# Patient Record
Sex: Male | Born: 1955 | Race: White | Hispanic: No | Marital: Single | Smoking: Current every day smoker
Health system: Southern US, Community
[De-identification: ages and names within clinical notes are randomized; demographics above are authoritative.]

## PROBLEM LIST (undated history)

## (undated) DIAGNOSIS — E785 Hyperlipidemia, unspecified: Secondary | ICD-10-CM

## (undated) DIAGNOSIS — I10 Essential (primary) hypertension: Secondary | ICD-10-CM

## (undated) DIAGNOSIS — E119 Type 2 diabetes mellitus without complications: Secondary | ICD-10-CM

## (undated) DIAGNOSIS — M545 Low back pain, unspecified: Secondary | ICD-10-CM

## (undated) HISTORY — DX: Type 2 diabetes mellitus without complications: E11.9

## (undated) HISTORY — DX: Low back pain, unspecified: M54.50

## (undated) HISTORY — DX: Essential (primary) hypertension: I10

## (undated) HISTORY — DX: Hyperlipidemia, unspecified: E78.5

---

## 2012-05-04 ENCOUNTER — Ambulatory Visit
Admission: RE | Admit: 2012-05-04 | Discharge: 2012-05-04 | Disposition: A | Discharge status: Home / Self Care | Payer: BC Managed Care – PPO | Source: Ambulatory Visit | Attending: Family Medicine | Admitting: Family Medicine

## 2012-05-04 ENCOUNTER — Other Ambulatory Visit: Payer: Self-pay | Admitting: Family Medicine

## 2012-05-04 DIAGNOSIS — M25579 Pain in unspecified ankle and joints of unspecified foot: Secondary | ICD-10-CM

## 2012-05-04 DIAGNOSIS — R609 Edema, unspecified: Secondary | ICD-10-CM

## 2012-05-04 DIAGNOSIS — M79673 Pain in unspecified foot: Secondary | ICD-10-CM

## 2013-04-08 IMAGING — CR DG OS CALCIS 2+V*L*
3 series · 3 of 3 positions shown · non-contrast
Comparison: None.

CLINICAL DATA: Heel pain, no injury

LEFT OS CALCIS - 2+ VIEW

[view not recorded (1 of 3)]
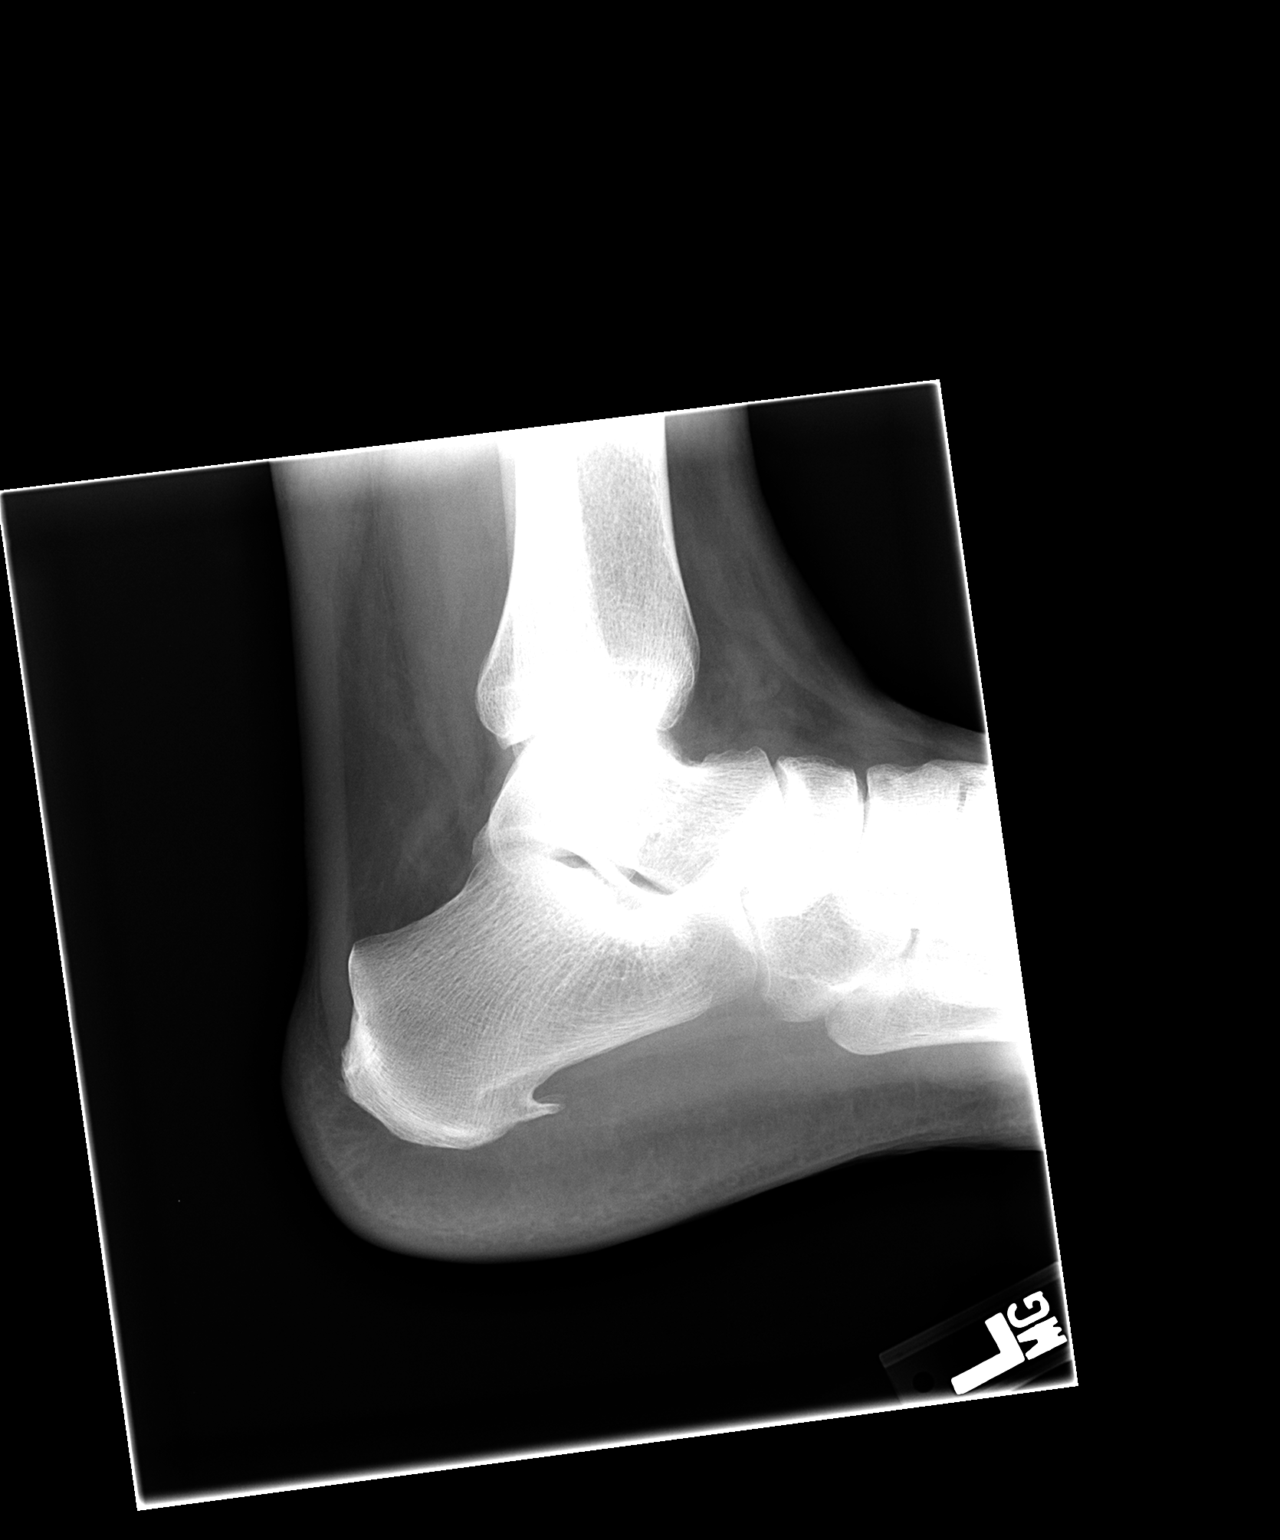

[view not recorded (2 of 3)]
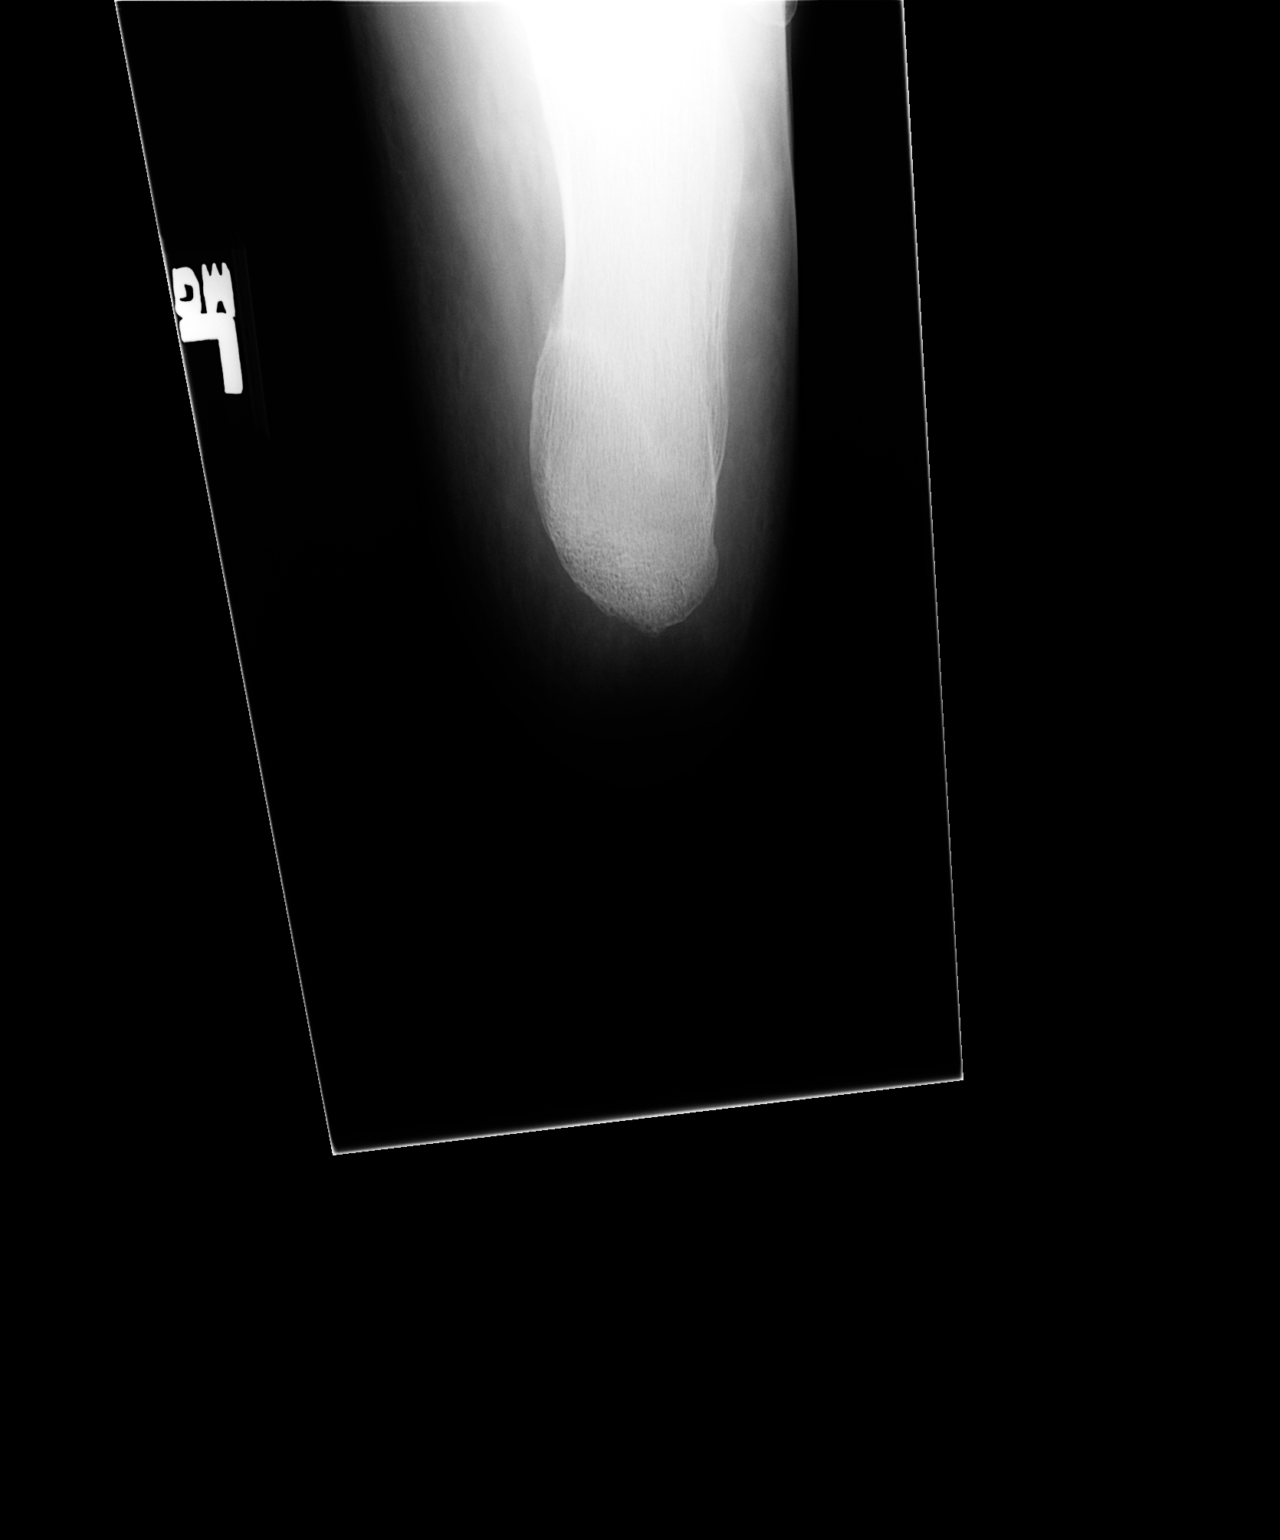

[view not recorded (3 of 3)]
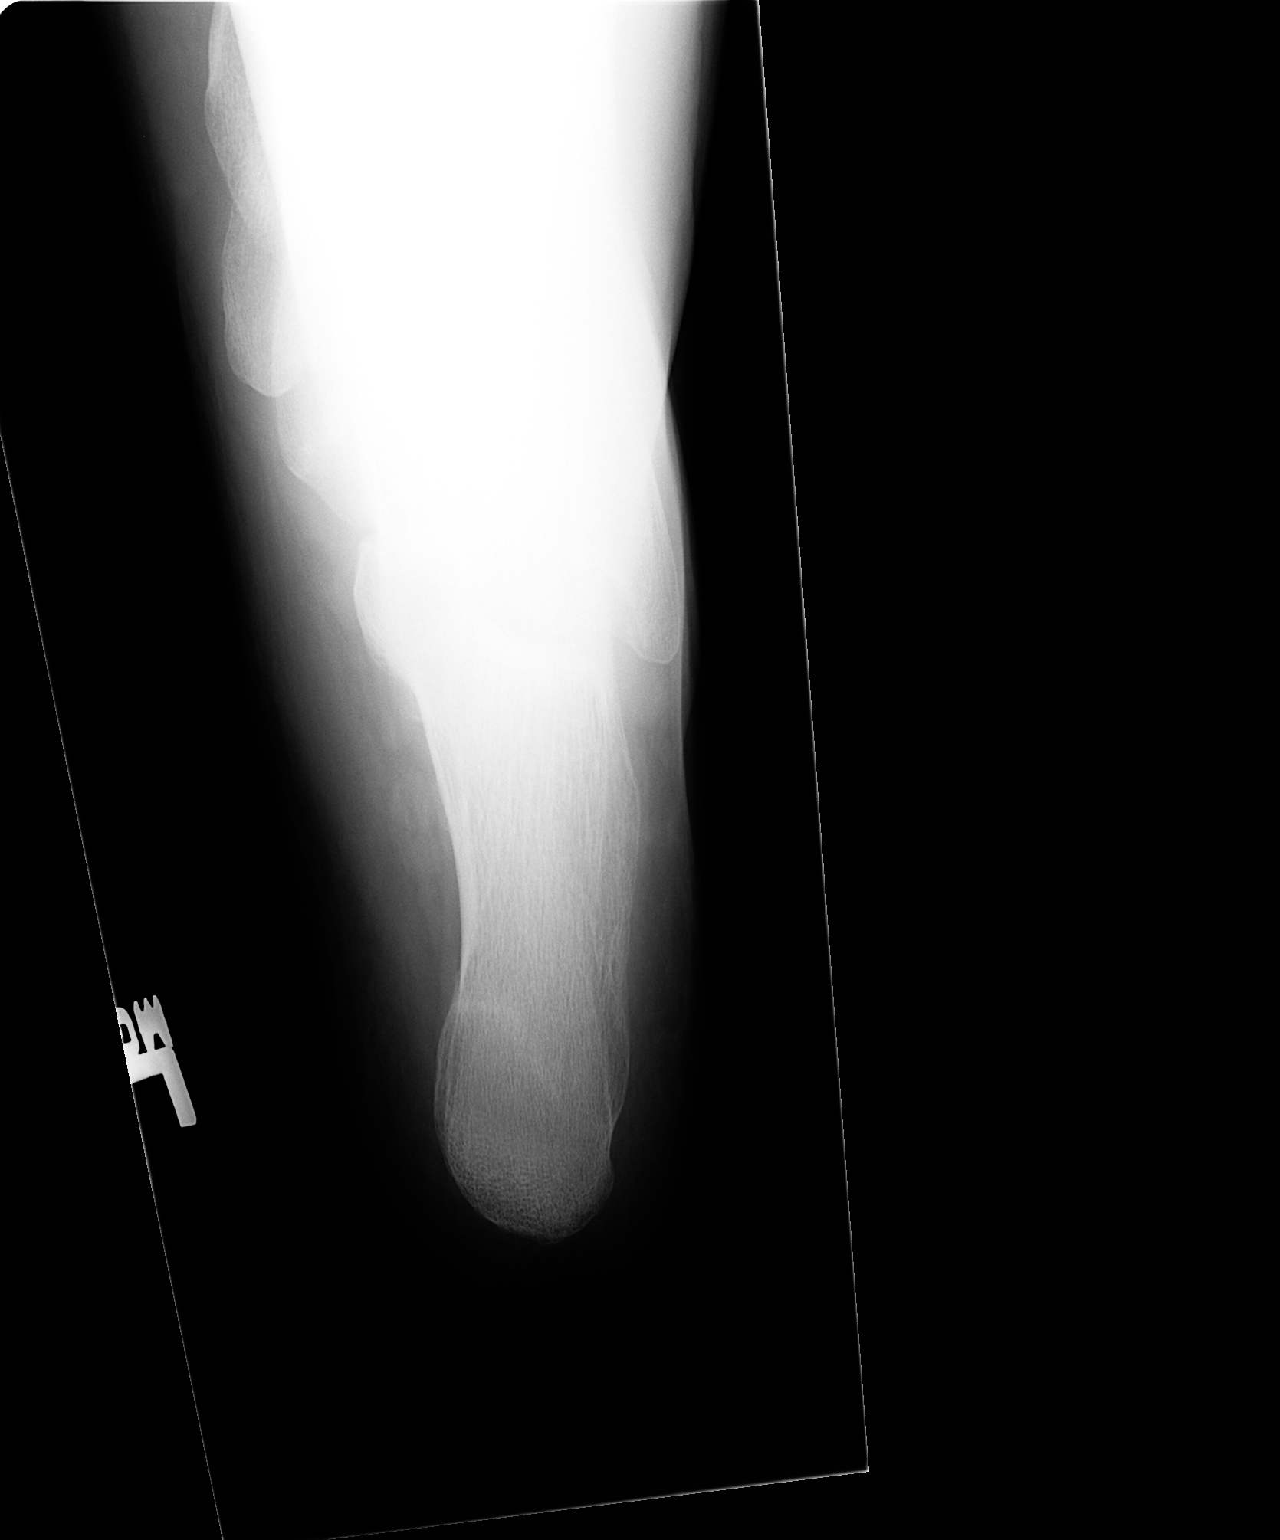

[3 of 3 positions shown; findings below may reference images not displayed]

FINDINGS: No acute bony abnormality is seen.  No focal
demineralization is noted.  There is a plantar calcaneal
degenerative spur present.
IMPRESSION: No acute abnormality.  Plantar calcaneal degenerative spur.

## 2014-08-21 ENCOUNTER — Ambulatory Visit (INDEPENDENT_AMBULATORY_CARE_PROVIDER_SITE_OTHER): Payer: BC Managed Care – PPO | Admitting: Emergency Medicine

## 2014-08-21 ENCOUNTER — Ambulatory Visit (INDEPENDENT_AMBULATORY_CARE_PROVIDER_SITE_OTHER): Payer: BC Managed Care – PPO

## 2014-08-21 VITALS — BP 110/74 | HR 114 | Temp 98.0°F | Resp 16 | Ht 67.0 in | Wt 201.0 lb

## 2014-08-21 DIAGNOSIS — M545 Low back pain, unspecified: Secondary | ICD-10-CM

## 2014-08-21 MED ORDER — CYCLOBENZAPRINE HCL 10 MG PO TABS: 10.0000 mg | ORAL_TABLET | Freq: Three times a day (TID) | ORAL | Status: DC | PRN

## 2014-08-21 MED ORDER — CELECOXIB 200 MG PO CAPS: 200.0000 mg | ORAL_CAPSULE | Freq: Two times a day (BID) | ORAL | Status: DC

## 2014-08-21 NOTE — Progress Notes (Signed)
[facility] [address] [place] [address] [phone]  Date:  08/21/2014   Name:  Daniel Johnson   DOB:  12-13-1956   MRN:  [id number]  PCP:  Ignacia Palma., MD    Chief Complaint: Back Pain and Hip Pain   History of Present Illness:  Daniel Johnson is a 58 y.o. very pleasant male patient who presents with the following:  Seen previously by FMD and referred to chiropractor who completed his treatment.  Has bee seeing a physical therapist twice weekly.  Has achieved little relieft.  Pain is not radiating, no neuro symptoms. Says injured lifting equipment. No improvement with over the counter medications or other home remedies.  Denies other complaint or health concern today.   There are no active problems to display for this patient.   Past Medical History  Diagnosis Date  . Diabetes mellitus without complication     No past surgical history on file.  History  Substance Use Topics  . Smoking status: Current Every Day Smoker -- 0.50 packs/day    Types: Cigarettes  . Smokeless tobacco: Not on file  . Alcohol Use: Yes    Family History  Problem Relation Age of Onset  . Diabetes Father     No Known Allergies  Medication list has been reviewed and updated.  No current outpatient prescriptions on file prior to visit.   No current facility-administered medications on file prior to visit.    Review of Systems:  As per HPI, otherwise negative.    Physical Examination: Filed Vitals:   08/21/14 1412  BP: 110/74  Pulse: 114  Temp: 98 F (36.7 C)  Resp: 16   Filed Vitals:   08/21/14 1412  Height:  (1.702 m)  Weight: 201 lb (91.173 kg)   Body mass index is 31.47 kg/(m^2). Ideal Body Weight: Weight in (lb) to have BMI = 25: 159.3  GEN: WDWN, NAD, Non-toxic, A & O x 3 HEENT: Atraumatic, Normocephalic. Neck supple. No masses, No LAD. Ears and Nose: No external deformity. CV: RRR, No M/G/R. No JVD. No thrill. No extra heart  sounds. PULM: CTA B, no wheezes, crackles, rhonchi. No retractions. No resp. distress. No accessory muscle use. ABD: S, NT, ND, +BS. No rebound. No HSM. EXTR: No c/c/e NEURO Normal gait.  PSYCH: Normally interactive. Conversant. Not depressed or anxious appearing.  Calm demeanor.    Assessment and Plan: Back pain celebrex Flexeril  Signed,  Phillips Odor, MD   [facility] reading (PRIMARY) by  Dr. Dareen Piano.  Mild scoliosis and incomplete pars.

## 2014-08-21 NOTE — Patient Instructions (Signed)
Back Pain, Adult Low back pain is very common. About 1 in 5 people have back pain.The cause of low back pain is rarely dangerous. The pain often gets better over time.About half of people with a sudden onset of back pain feel better in just 2 weeks. About 8 in 10 people feel better by 6 weeks.  CAUSES Some common causes of back pain include:  Strain of the muscles or ligaments supporting the spine.  Wear and tear (degeneration) of the spinal discs.  Arthritis.  Direct injury to the back. DIAGNOSIS Most of the time, the direct cause of low back pain is not known.However, back pain can be treated effectively even when the exact cause of the pain is unknown.Answering your caregiver's questions about your overall health and symptoms is one of the most accurate ways to make sure the cause of your pain is not dangerous. If your caregiver needs more information, he or she may order lab work or imaging tests (X-rays or MRIs).However, even if imaging tests show changes in your back, this usually does not require surgery. HOME CARE INSTRUCTIONS For many people, back pain returns.Since low back pain is rarely dangerous, it is often a condition that people can learn to manageon their own.   Remain active. It is stressful on the back to sit or stand in one place. Do not sit, drive, or stand in one place for more than 30 minutes at a time. Take short walks on level surfaces as soon as pain allows.Try to increase the length of time you walk each day.  Do not stay in bed.Resting more than 1 or 2 days can delay your recovery.  Do not avoid exercise or work.Your body is made to move.It is not dangerous to be active, even though your back may hurt.Your back will likely heal faster if you return to being active before your pain is gone.  Pay attention to your body when you bend and lift. Many people have less discomfortwhen lifting if they bend their knees, keep the load close to their bodies,and  avoid twisting. Often, the most comfortable positions are those that put less stress on your recovering back.  Find a comfortable position to sleep. Use a firm mattress and lie on your side with your knees slightly bent. If you lie on your back, put a pillow under your knees.  Only take over-the-counter or prescription medicines as directed by your caregiver. Over-the-counter medicines to reduce pain and inflammation are often the most helpful.Your caregiver may prescribe muscle relaxant drugs.These medicines help dull your pain so you can more quickly return to your normal activities and healthy exercise.  Put ice on the injured area.  Put ice in a plastic bag.  Place a towel between your skin and the bag.  Leave the ice on for 15-20 minutes, 03-04 times a day for the first 2 to 3 days. After that, ice and heat may be alternated to reduce pain and spasms.  Ask your caregiver about trying back exercises and gentle massage. This may be of some benefit.  Avoid feeling anxious or stressed.Stress increases muscle tension and can worsen back pain.It is important to recognize when you are anxious or stressed and learn ways to manage it.Exercise is a great option. SEEK MEDICAL CARE IF:  You have pain that is not relieved with rest or medicine.  You have pain that does not improve in 1 week.  You have new symptoms.  You are generally not feeling well. SEEK   IMMEDIATE MEDICAL CARE IF:   You have pain that radiates from your back into your legs.  You develop new bowel or bladder control problems.  You have unusual weakness or numbness in your arms or legs.  You develop nausea or vomiting.  You develop abdominal pain.  You feel faint. Document Released: 12/13/2005 Document Revised: 06/13/2012 Document Reviewed: 04/16/2014 ExitCare Patient Information 2015 [employer]. This information is not intended to replace advice given to you by your health care provider. Make sure you  discuss any questions you have with your health care provider.  

## 2014-08-23 ENCOUNTER — Telehealth: Payer: Self-pay

## 2014-08-23 NOTE — Telephone Encounter (Signed)
PA needed for celecoxib. Started on covermymeds. LMOM for pt to CB. Need to know what other NSAIDS pt has tried previously.

## 2014-08-30 NOTE — Telephone Encounter (Signed)
Reached pt and he stated that his pain is bearable and he has decided to not take this med now. He "doesn't like the way some medicines make him feel". instr'd pt to CB if he decides he would like to try it. Pt agreed.

## 2020-05-15 ENCOUNTER — Other Ambulatory Visit: Payer: Self-pay | Admitting: Family Medicine

## 2020-05-15 DIAGNOSIS — Z720 Tobacco use: Secondary | ICD-10-CM

## 2020-05-15 DIAGNOSIS — Z136 Encounter for screening for cardiovascular disorders: Secondary | ICD-10-CM

## 2020-05-28 ENCOUNTER — Ambulatory Visit
Admission: RE | Admit: 2020-05-28 | Discharge: 2020-05-28 | Disposition: A | Discharge status: Home / Self Care | Payer: Medicare Other | Source: Ambulatory Visit | Attending: Family Medicine | Admitting: Family Medicine

## 2020-05-28 ENCOUNTER — Other Ambulatory Visit: Payer: Self-pay

## 2020-05-28 DIAGNOSIS — Z720 Tobacco use: Secondary | ICD-10-CM

## 2020-05-28 DIAGNOSIS — Z136 Encounter for screening for cardiovascular disorders: Secondary | ICD-10-CM

## 2021-05-02 IMAGING — US US ABDOMINAL AORTA SCREENING AAA
1 series · 14 of 23 positions shown · non-contrast
Comparison: No prior.

CLINICAL DATA: Medicare screening.

EXAM:
US ABDOMINAL AORTA MEDICARE SCREENING
TECHNIQUE: Ultrasound examination of the abdominal aorta was performed as a
screening evaluation for abdominal aortic aneurysm.

[Series 1: us abdominal aorta screening aaa · 0.26mm/px · 14 of 23 slices shown]
[im 1/23]
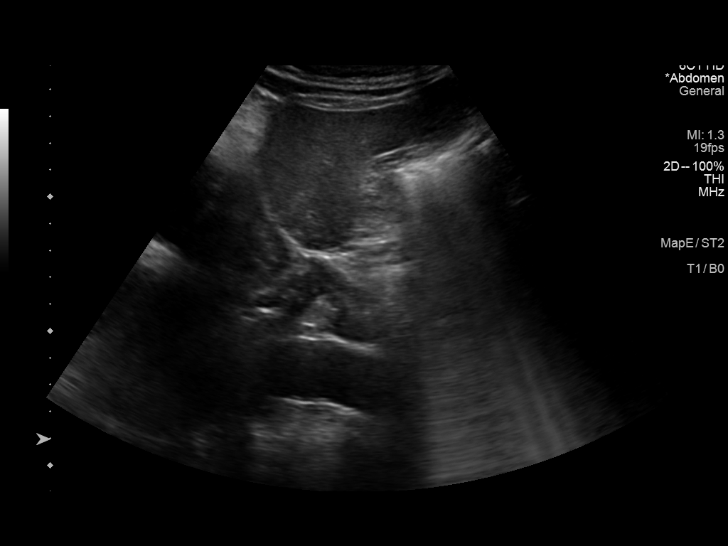
[im 3/23]
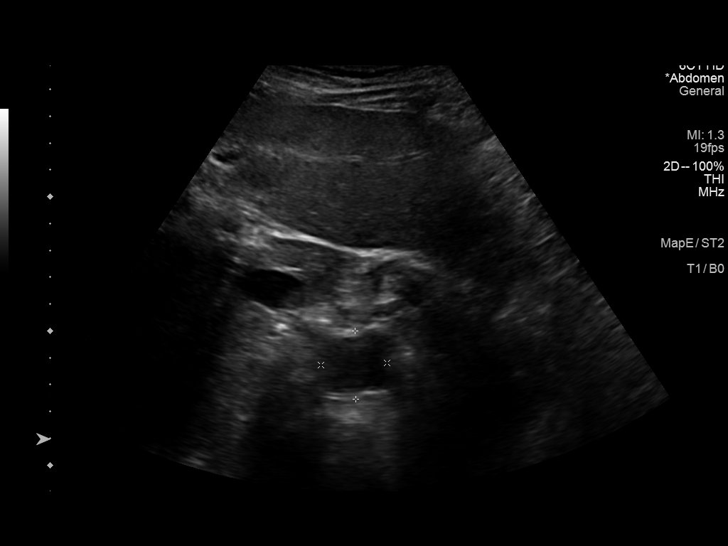
[im 5/23]
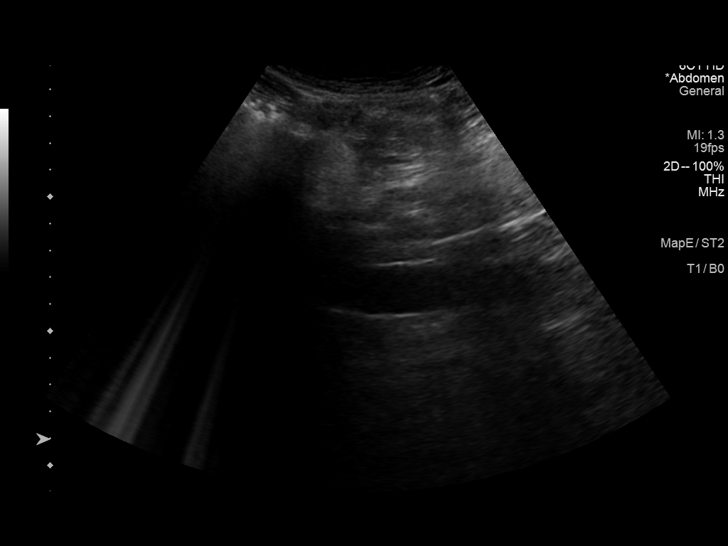
[im 6/23]
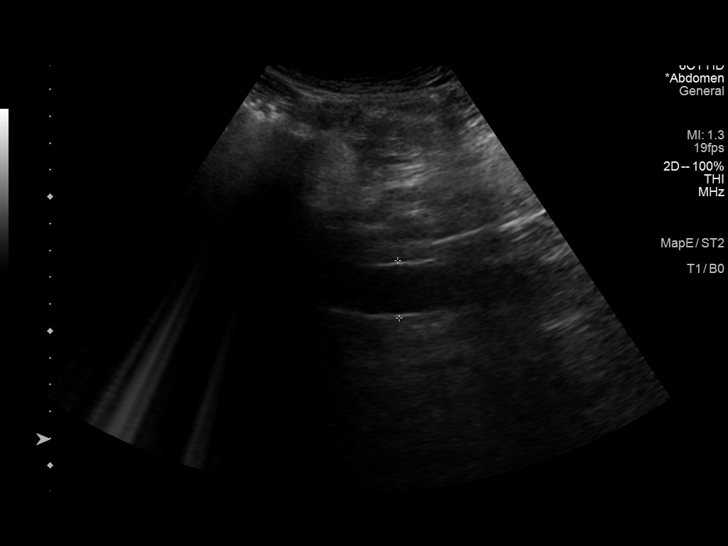
[im 8/23]
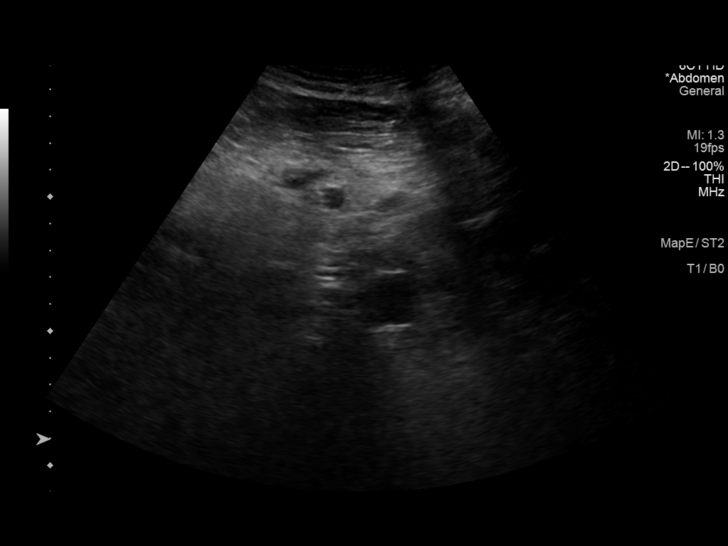
[im 10/23]
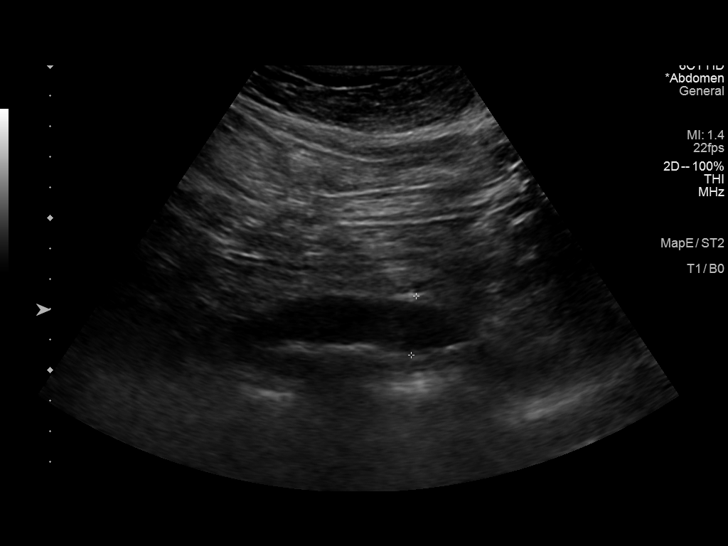
[im 11/23]
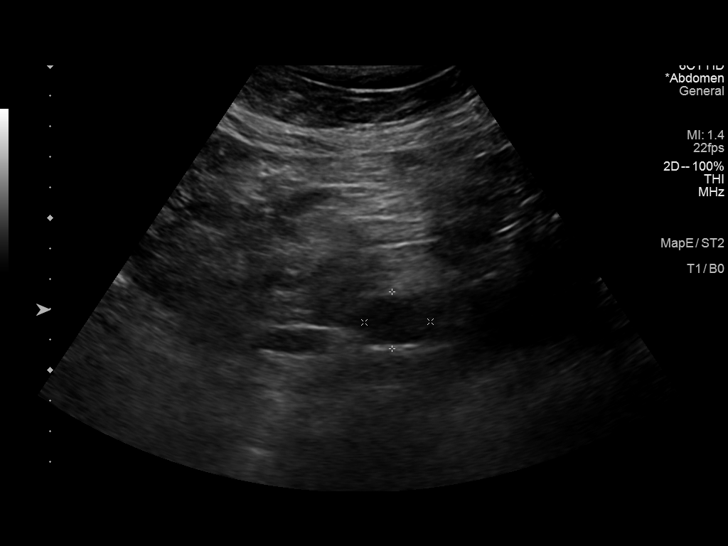
[im 13/23]
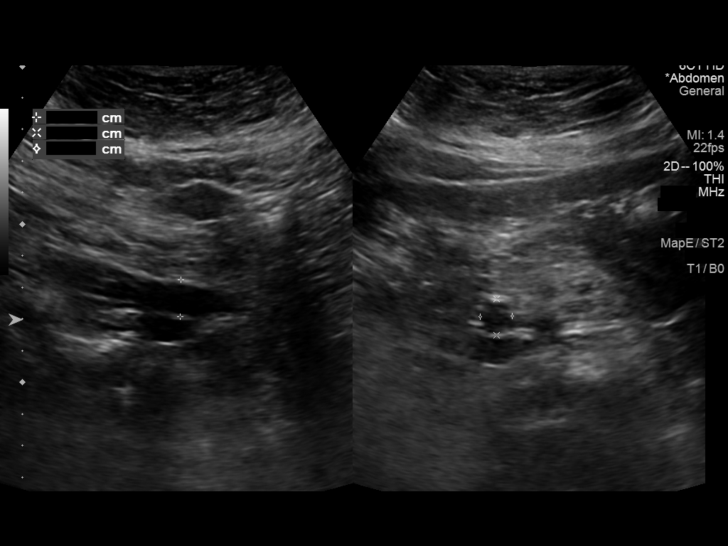
[im 14/23]
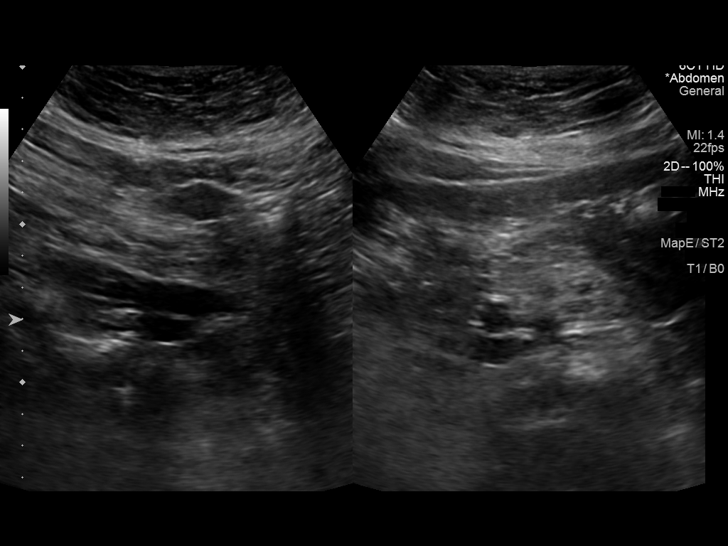
[im 16/23]
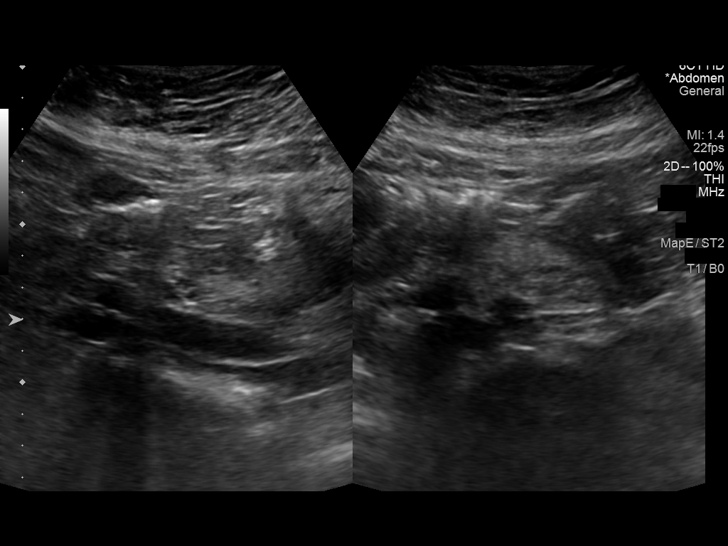
[im 18/23]
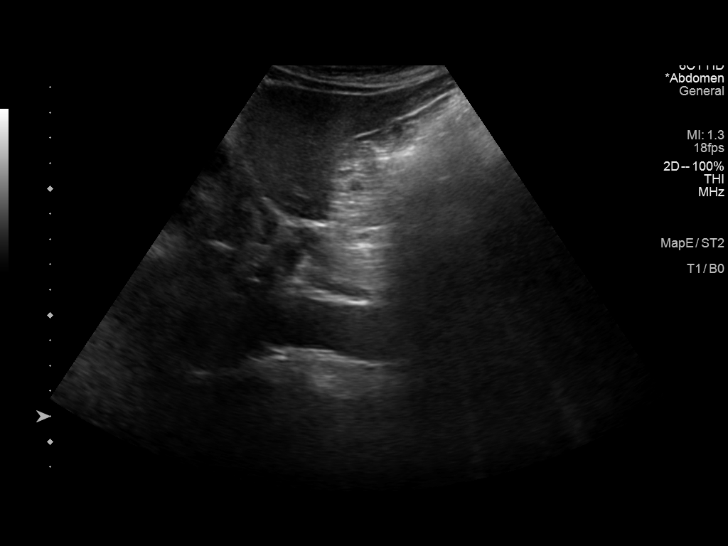
[im 19/23]
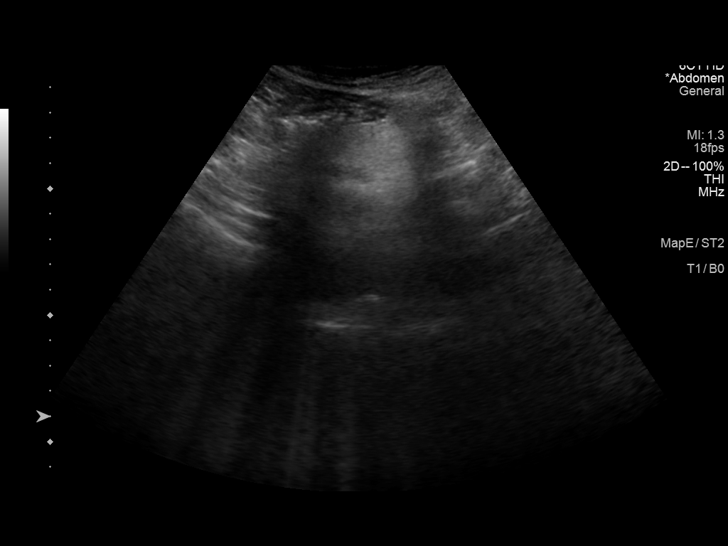
[im 21/23]
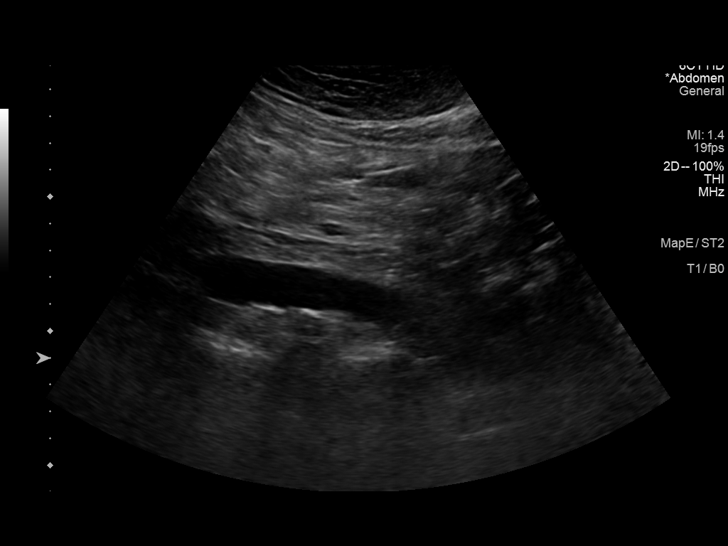
[im 23/23]
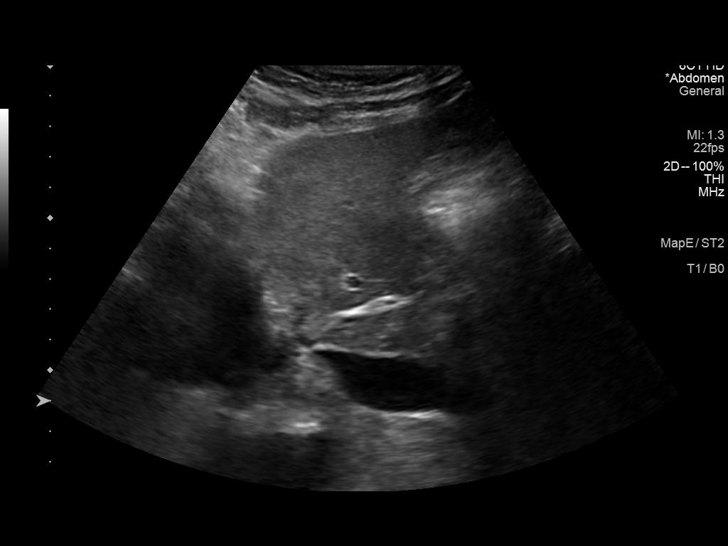

[14 of 23 positions shown; findings below may reference images not displayed]

FINDINGS: Abdominal aortic measurements as follows:

Proximal:  2.5 cm cm

Mid:  2.2 cm cm

Distal:  2.2 cm cm
IMPRESSION: No evidence of aortic aneurysm.

## 2024-03-20 ENCOUNTER — Ambulatory Visit (INDEPENDENT_AMBULATORY_CARE_PROVIDER_SITE_OTHER): Admitting: Family Medicine

## 2024-03-20 ENCOUNTER — Encounter: Payer: Self-pay | Admitting: Family Medicine

## 2024-03-20 VITALS — BP 112/64 | HR 88 | Temp 97.9°F | Ht 67.0 in | Wt 195.5 lb

## 2024-03-20 DIAGNOSIS — N400 Enlarged prostate without lower urinary tract symptoms: Secondary | ICD-10-CM

## 2024-03-20 DIAGNOSIS — M545 Low back pain, unspecified: Secondary | ICD-10-CM

## 2024-03-20 DIAGNOSIS — Z7984 Long term (current) use of oral hypoglycemic drugs: Secondary | ICD-10-CM | POA: Diagnosis not present

## 2024-03-20 DIAGNOSIS — Z125 Encounter for screening for malignant neoplasm of prostate: Secondary | ICD-10-CM

## 2024-03-20 DIAGNOSIS — E118 Type 2 diabetes mellitus with unspecified complications: Secondary | ICD-10-CM

## 2024-03-20 DIAGNOSIS — G8929 Other chronic pain: Secondary | ICD-10-CM

## 2024-03-20 DIAGNOSIS — Z1211 Encounter for screening for malignant neoplasm of colon: Secondary | ICD-10-CM
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
## 2024-03-21 LAB — PSA: PSA: 0.27 ng/mL (ref <=4.00)

## 2024-04-04 ENCOUNTER — Telehealth: Payer: Self-pay

## 2024-04-04 ENCOUNTER — Other Ambulatory Visit: Payer: Self-pay | Admitting: Family Medicine

## 2024-04-04 NOTE — Telephone Encounter (Signed)
 Reason for CRM: patient returning a call regarding rosuvastatin 20mg . Patient is still taking the medication once a day .

## 2024-04-04 NOTE — Telephone Encounter (Signed)
 Copied from CRM [id number]. Topic: General - Other >> Apr 04, 2024  4:19 PM Daniel Johnson wrote: Reason for CRM: patient returning a call regarding rosuvastatin 20mg . Patient is still taking the medication once a day .

## 2024-04-04 NOTE — Telephone Encounter (Signed)
 Prescription Request  04/04/2024  LOV: 03/20/24  What is the name of the medication or equipment? zolpidem (AMBIEN) 10 MG tablet [[id number]]   Have you contacted your pharmacy to request a refill? Yes   Which pharmacy would you like this sent to?  [id number] [place], [place] - [address] [place] [address] Phone: [phone] Fax: [phone]    Patient notified that their request is being sent to the clinical staff for review and that they should receive a response within 2 business days.   Please advise at [facility] [phone]

## 2024-04-04 NOTE — Telephone Encounter (Signed)
 Prescription Request  04/04/2024  LOV: 03/20/24  What is the name of the medication or equipment? Rosuvastatin calcium 20 MG (med not found on current med list)  Have you contacted your pharmacy to request a refill? Yes   Which pharmacy would you like this sent to?  [id number] [place], [place] - [address] [place] [address] Phone: [phone] Fax: [phone]    Patient notified that their request is being sent to the clinical staff for review and that they should receive a response within 2 business days.   Please advise at [facility] [phone]

## 2024-04-04 NOTE — Telephone Encounter (Signed)
 Patient called, left VM to return the call to the office. The pharmacy requested rosuvastatin which is not on the patient's current or past medication list. Will need to know from the patient if he is taking rosuvastatin, dosage, how often. Will route to the office for the provider to review.

## 2024-04-05 ENCOUNTER — Other Ambulatory Visit: Payer: Self-pay

## 2024-04-05 ENCOUNTER — Other Ambulatory Visit: Payer: Self-pay | Admitting: Family Medicine

## 2024-04-05 MED ORDER — ZOLPIDEM TARTRATE 10 MG PO TABS: 10.0000 mg | ORAL_TABLET | Freq: Every evening | ORAL | 3 refills | Status: DC | PRN

## 2024-04-05 MED ORDER — ROSUVASTATIN CALCIUM 20 MG PO TABS: 20.0000 mg | ORAL_TABLET | Freq: Every day | ORAL | 3 refills | Status: AC

## 2024-04-05 NOTE — Telephone Encounter (Signed)
 Requested medications are due for refill today.  yes  Requested medications are on the active medications list.  yes  Last refill. 11/17/2023  Future visit scheduled.   yes  Notes to clinic.  Medication is historical. Missing labs.    Requested Prescriptions  Pending Prescriptions Disp Refills   lisinopril (ZESTRIL) 20 MG tablet [Pharmacy Med Name: LISINOPRIL 20 MG TABLET] 90 tablet 1    Sig: TAKE 1/2 TO 1 TABLET BY MOUTH DAILY TO KEEP BP BELOW 130-135/80-85. CALL MD IF THIS CAUSES LIGHT HEADEDNESS WITH STANDING     Cardiovascular:  ACE Inhibitors Failed - 04/05/2024 10:40 AM      Failed - Cr in normal range and within 180 days    No results found for: "CREATININE", "LABCREAU", "LABCREA", "POCCRE"       Failed - K in normal range and within 180 days    No results found for: "K", "POTASSIUM", "POCK"       Failed - Valid encounter within last 6 months    Recent Outpatient Visits           2 weeks ago Chronic low back pain, unspecified back pain laterality, unspecified whether sciatica present   [facility] Donita Brooks, MD   9 years ago Bilateral low back pain without sciatica   [facility], Tessa Lerner, MD              Passed - Patient is not pregnant      Passed - Last BP in normal range    BP Readings from Last 1 Encounters:  03/20/24 112/64

## 2024-07-05 ENCOUNTER — Ambulatory Visit: Admitting: Family Medicine

## 2024-07-05 ENCOUNTER — Encounter: Payer: Self-pay | Admitting: Family Medicine

## 2024-07-05 VITALS — BP 122/80 | HR 67 | Temp 97.7°F | Ht 67.0 in | Wt 191.1 lb

## 2024-07-05 DIAGNOSIS — Z8589 Personal history of malignant neoplasm of other organs and systems: Secondary | ICD-10-CM | POA: Insufficient documentation

## 2024-07-05 DIAGNOSIS — E118 Type 2 diabetes mellitus with unspecified complications: Secondary | ICD-10-CM

## 2024-07-05 DIAGNOSIS — C44722 Squamous cell carcinoma of skin of right lower limb, including hip: Secondary | ICD-10-CM | POA: Insufficient documentation
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
## 2024-07-06 ENCOUNTER — Ambulatory Visit: Payer: Self-pay | Admitting: Family Medicine

## 2024-07-06 LAB — COMPREHENSIVE METABOLIC PANEL WITH GFR
AG Ratio: 1.9 (calc) (ref 1.0–2.5)
ALT: 14 U/L (ref 9–46)
AST: 17 U/L (ref 10–35)
Albumin: 4.5 g/dL (ref 3.6–5.1)
Alkaline phosphatase (APISO): 29 U/L — ABNORMAL LOW (ref 35–144)
BUN: 8 mg/dL (ref 7–25)
CO2: 29 mmol/L (ref 20–32)
Calcium: 9.1 mg/dL (ref 8.6–10.3)
Chloride: 96 mmol/L — ABNORMAL LOW (ref 98–110)
Creat: 0.9 mg/dL (ref 0.70–1.35)
Globulin: 2.4 g/dL (ref 1.9–3.7)
Glucose, Bld: 124 mg/dL — ABNORMAL HIGH (ref 65–99)
Potassium: 4.6 mmol/L (ref 3.5–5.3)
Sodium: 131 mmol/L — ABNORMAL LOW (ref 135–146)
Total Bilirubin: 0.7 mg/dL (ref 0.2–1.2)
Total Protein: 6.9 g/dL (ref 6.1–8.1)
eGFR: 92 mL/min/1.73m2 (ref >=60)

## 2024-07-06 LAB — HEMOGLOBIN A1C
Hgb A1c MFr Bld: 8.1 % — ABNORMAL HIGH (ref <5.7)
Mean Plasma Glucose: 186 mg/dL
eAG (mmol/L): 10.3 mmol/L

## 2024-07-06 LAB — CBC WITH DIFFERENTIAL/PLATELET
Absolute Lymphocytes: 2005 {cells}/uL (ref 850–3900)
Absolute Monocytes: 599 {cells}/uL (ref 200–950)
Basophils Absolute: 28 {cells}/uL (ref 0–200)
Basophils Relative: 0.5 %
Eosinophils Absolute: 398 {cells}/uL (ref 15–500)
Eosinophils Relative: 7.1 %
HCT: 42.1 % (ref 38.5–50.0)
Hemoglobin: 13.7 g/dL (ref 13.2–17.1)
MCH: 30.8 pg (ref 27.0–33.0)
MCHC: 32.5 g/dL (ref 32.0–36.0)
MCV: 94.6 fL (ref 80.0–100.0)
MPV: 9 fL (ref 7.5–12.5)
Monocytes Relative: 10.7 %
Neutro Abs: 2570 {cells}/uL (ref 1500–7800)
Neutrophils Relative %: 45.9 %
Platelets: 274 Thousand/uL (ref 140–400)
RBC: 4.45 Million/uL (ref 4.20–5.80)
RDW: 12.5 % (ref 11.0–15.0)
Total Lymphocyte: 35.8 %
WBC: 5.6 Thousand/uL (ref 3.8–10.8)

## 2024-07-06 LAB — LIPID PANEL
Cholesterol: 101 mg/dL (ref <200)
HDL: 60 mg/dL (ref >=40)
LDL Cholesterol (Calc): 27 mg/dL
Non-HDL Cholesterol (Calc): 41 mg/dL (ref <130)
Total CHOL/HDL Ratio: 1.7 (calc) (ref <5.0)
Triglycerides: 67 mg/dL (ref <150)

## 2024-07-06 LAB — MICROALBUMIN / CREATININE URINE RATIO
Creatinine, Urine: 82 mg/dL (ref 20–320)
Microalb Creat Ratio: 5 mg/g{creat} (ref <30)
Microalb, Ur: 0.4 mg/dL

## 2024-07-26 ENCOUNTER — Ambulatory Visit: Admitting: Family Medicine

## 2024-07-26 ENCOUNTER — Ambulatory Visit
Admission: RE | Admit: 2024-07-26 | Discharge: 2024-07-26 | Disposition: A | Discharge status: Home / Self Care | Source: Ambulatory Visit | Attending: Family Medicine | Admitting: Family Medicine

## 2024-07-26 ENCOUNTER — Encounter: Payer: Self-pay | Admitting: Family Medicine

## 2024-07-26 VITALS — BP 120/72 | HR 77 | Temp 98.3°F | Ht 67.0 in | Wt 192.0 lb

## 2024-07-26 DIAGNOSIS — M545 Low back pain, unspecified: Secondary | ICD-10-CM

## 2024-07-26 DIAGNOSIS — M542 Cervicalgia: Secondary | ICD-10-CM

## 2024-07-26 MED ORDER — CYCLOBENZAPRINE HCL 10 MG PO TABS: 10.0000 mg | ORAL_TABLET | Freq: Three times a day (TID) | ORAL | 0 refills | Status: AC | PRN
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
## 2024-07-30 ENCOUNTER — Telehealth: Payer: Self-pay

## 2024-07-30 NOTE — Telephone Encounter (Signed)
 Copied from CRM [id number]. Topic: Clinical - Lab/Test Results >> Jul 30, 2024 10:50 AM Daniel Johnson wrote: Reason for CRM: Pt is calling in for imaging results from 7/31. I told the pt the results are not ready yet b/c Daniel Johnson has not left any notes on the results. Please contact the pt at [phone] when the results are ready.

## 2024-08-02 ENCOUNTER — Ambulatory Visit: Payer: Self-pay | Admitting: Family Medicine

## 2024-08-02 NOTE — Progress Notes (Signed)
 Called w/ no  contact made. Lvm and sent mychart result letter for more information.

## 2024-08-14 ENCOUNTER — Telehealth (INDEPENDENT_AMBULATORY_CARE_PROVIDER_SITE_OTHER): Admitting: Family Medicine

## 2024-08-14 ENCOUNTER — Ambulatory Visit: Admitting: Family Medicine

## 2024-08-14 DIAGNOSIS — M51362 Other intervertebral disc degeneration, lumbar region with discogenic back pain and lower extremity pain: Secondary | ICD-10-CM | POA: Diagnosis not present

## 2024-08-14 DIAGNOSIS — M545 Low back pain, unspecified: Secondary | ICD-10-CM | POA: Diagnosis not present

## 2024-08-14 DIAGNOSIS — M5416 Radiculopathy, lumbar region: Secondary | ICD-10-CM

## 2024-08-14 DIAGNOSIS — M4186 Other forms of scoliosis, lumbar region: Secondary | ICD-10-CM

## 2024-08-14 DIAGNOSIS — G8929 Other chronic pain: Secondary | ICD-10-CM
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
## 2024-08-21 ENCOUNTER — Ambulatory Visit
Admission: RE | Admit: 2024-08-21 | Discharge: 2024-08-21 | Disposition: A | Discharge status: Home / Self Care | Source: Ambulatory Visit | Attending: Family Medicine | Admitting: Family Medicine

## 2024-08-21 DIAGNOSIS — M51362 Other intervertebral disc degeneration, lumbar region with discogenic back pain and lower extremity pain: Secondary | ICD-10-CM

## 2024-08-21 DIAGNOSIS — M5416 Radiculopathy, lumbar region: Secondary | ICD-10-CM

## 2024-09-03 ENCOUNTER — Ambulatory Visit: Payer: Self-pay | Admitting: Family Medicine

## 2024-09-04 ENCOUNTER — Other Ambulatory Visit: Payer: Self-pay

## 2024-09-04 ENCOUNTER — Other Ambulatory Visit: Payer: Self-pay | Admitting: Family Medicine

## 2024-09-04 DIAGNOSIS — M51362 Other intervertebral disc degeneration, lumbar region with discogenic back pain and lower extremity pain: Secondary | ICD-10-CM

## 2024-09-04 DIAGNOSIS — M5416 Radiculopathy, lumbar region: Secondary | ICD-10-CM

## 2024-09-04 DIAGNOSIS — M545 Low back pain, unspecified: Secondary | ICD-10-CM

## 2024-09-04 DIAGNOSIS — M542 Cervicalgia: Secondary | ICD-10-CM

## 2024-09-04 DIAGNOSIS — M51372 Other intervertebral disc degeneration, lumbosacral region with discogenic back pain and lower extremity pain: Secondary | ICD-10-CM

## 2024-09-04 DIAGNOSIS — M4186 Other forms of scoliosis, lumbar region: Secondary | ICD-10-CM

## 2024-10-05 ENCOUNTER — Other Ambulatory Visit: Payer: Self-pay | Admitting: Family Medicine
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
## 2024-10-30 ENCOUNTER — Telehealth: Payer: Self-pay | Admitting: Family Medicine

## 2024-10-30 NOTE — Telephone Encounter (Signed)
 Copied from CRM #[id number]. Topic: Clinical - Medical Advice >> Oct 30, 2024  1:53 PM Ivette P wrote: Reason for CRM: Pt wants to know if he should keep PT appt for tomorrow or should cancel until gets MRI results.   Pls follow up with pt before end of day.

## 2024-10-31 ENCOUNTER — Ambulatory Visit (INDEPENDENT_AMBULATORY_CARE_PROVIDER_SITE_OTHER): Admitting: Rehabilitative and Restorative Service Providers"

## 2024-10-31 ENCOUNTER — Encounter: Payer: Self-pay | Admitting: Rehabilitative and Restorative Service Providers"

## 2024-10-31 DIAGNOSIS — M6281 Muscle weakness (generalized): Secondary | ICD-10-CM | POA: Diagnosis not present

## 2024-10-31 DIAGNOSIS — R293 Abnormal posture: Secondary | ICD-10-CM | POA: Diagnosis not present

## 2024-10-31 DIAGNOSIS — R262 Difficulty in walking, not elsewhere classified: Secondary | ICD-10-CM | POA: Diagnosis not present

## 2024-10-31 DIAGNOSIS — M5459 Other low back pain: Secondary | ICD-10-CM | POA: Diagnosis not present

## 2024-11-07 ENCOUNTER — Encounter: Payer: Self-pay | Admitting: Rehabilitative and Restorative Service Providers"

## 2024-11-07 ENCOUNTER — Ambulatory Visit: Admitting: Rehabilitative and Restorative Service Providers"

## 2024-11-07 DIAGNOSIS — M5459 Other low back pain: Secondary | ICD-10-CM

## 2024-11-07 DIAGNOSIS — R293 Abnormal posture: Secondary | ICD-10-CM | POA: Diagnosis not present

## 2024-11-07 DIAGNOSIS — R262 Difficulty in walking, not elsewhere classified: Secondary | ICD-10-CM

## 2024-11-07 DIAGNOSIS — M6281 Muscle weakness (generalized): Secondary | ICD-10-CM | POA: Diagnosis not present
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
## 2024-11-14 ENCOUNTER — Encounter: Payer: Self-pay | Admitting: Rehabilitative and Restorative Service Providers"

## 2024-11-14 ENCOUNTER — Ambulatory Visit (INDEPENDENT_AMBULATORY_CARE_PROVIDER_SITE_OTHER): Admitting: Rehabilitative and Restorative Service Providers"

## 2024-11-14 DIAGNOSIS — M5459 Other low back pain: Secondary | ICD-10-CM

## 2024-11-14 DIAGNOSIS — R293 Abnormal posture: Secondary | ICD-10-CM

## 2024-11-14 DIAGNOSIS — M6281 Muscle weakness (generalized): Secondary | ICD-10-CM

## 2024-11-14 DIAGNOSIS — R262 Difficulty in walking, not elsewhere classified: Secondary | ICD-10-CM
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
## 2024-11-21 ENCOUNTER — Encounter: Admitting: Rehabilitative and Restorative Service Providers"

## 2024-11-28 ENCOUNTER — Encounter: Admitting: Rehabilitative and Restorative Service Providers"

## 2024-12-03 ENCOUNTER — Telehealth: Payer: Self-pay | Admitting: Family Medicine

## 2024-12-03 NOTE — Telephone Encounter (Unsigned)
 Copied from CRM [id number]. Topic: Clinical - Medication Refill >> Dec 03, 2024  2:35 PM Ivette P wrote: Medication: JENTADUETO 2.04-999 MG TABS  Has the patient contacted their pharmacy? Yes (Agent: If no, request that the patient contact the pharmacy for the refill. If patient does not wish to contact the pharmacy document the reason why and proceed with request.) (Agent: If yes, when and what did the pharmacy advise?)  This is the patient's preferred pharmacy:  [id number] [address] [facility] - [address] [place] [address] Phone: [phone] Fax: [phone]  Is this the correct pharmacy for this prescription? Yes If no, delete pharmacy and type the correct one.   Has the prescription been filled recently? No  Is the patient out of the medication? Yes  Has the patient been seen for an appointment in the last year OR does the patient have an upcoming appointment? Yes  Can we respond through MyChart? Yes  Agent: Please be advised that Rx refills may take up to 3 business days. We ask that you follow-up with your pharmacy.

## 2024-12-05 ENCOUNTER — Other Ambulatory Visit: Payer: Self-pay

## 2024-12-05 ENCOUNTER — Telehealth: Payer: Self-pay

## 2024-12-05 ENCOUNTER — Encounter: Admitting: Rehabilitative and Restorative Service Providers"

## 2024-12-05 NOTE — Telephone Encounter (Signed)
 Requested medication (s) are due for refill today - unsure  Requested medication (s) are on the active medication list -yes  Future visit scheduled -yes  Last refill: 01/18/24  Notes to clinic: listed as historical medication   Requested Prescriptions  Pending Prescriptions Disp Refills   JENTADUETO 2.04-999 MG TABS 60 tablet     Sig: Take 1 tablet by mouth 2 (two) times daily. Pt is taking 1.5 tabs per day.     Endocrinology:  Diabetes - Biguanide + DPP-4 Inhibitor Combos Failed - 12/05/2024  2:13 PM      Failed - HBA1C is between 0 and 7.9 and within 180 days    Hgb A1c MFr Bld  Date Value Ref Range Status  07/05/2024 8.1 (H) <5.7 % Final    Comment:    For someone without known diabetes, a hemoglobin A1c value of 6.5% or greater indicates that they may have  diabetes and this should be confirmed with a follow-up  test. . For someone with known diabetes, a value <7% indicates  that their diabetes is well controlled and a value  greater than or equal to 7% indicates suboptimal  control. A1c targets should be individualized based on  duration of diabetes, age, comorbid conditions, and  other considerations. . Currently, no consensus exists regarding use of hemoglobin A1c for diagnosis of diabetes for children. .          Failed - B12 Level in normal range and within 720 days    No results found for: VITAMINB12       Passed - Cr in normal range and within 360 days    Creat  Date Value Ref Range Status  07/05/2024 0.90 0.70 - 1.35 mg/dL Final   Creatinine, Urine  Date Value Ref Range Status  07/05/2024 82 20 - 320 mg/dL Final         Passed - eGFR in normal range and within 360 days    eGFR  Date Value Ref Range Status  07/05/2024 92 > OR = 60 mL/min/1.89m2 Final         Passed - Valid encounter within last 6 months    Recent Outpatient Visits           3 months ago Levoscoliosis of lumbar spine   [place] [facility] Pickard, Butler DASEN, MD   4 months ago Neck pain   [place] [facility] Duanne Butler DASEN, MD   5 months ago Controlled type 2 diabetes mellitus with complication, without long-term current use of insulin [facility])   [place] [facility] Duanne Butler DASEN, MD   8 months ago Chronic low back pain, unspecified back pain laterality, unspecified whether sciatica present   [facility] Duanne Butler DASEN, MD   10 years ago Bilateral low back pain without sciatica   [facility], Juliane RAMAN, MD              Passed - CBC within normal limits and completed in the last 12 months    WBC  Date Value Ref Range Status  07/05/2024 5.6 3.8 - 10.8 Thousand/uL Final   RBC  Date Value Ref Range Status  07/05/2024 4.45 4.20 - 5.80 Million/uL Final   Hemoglobin  Date Value Ref Range Status  07/05/2024 13.7 13.2 - 17.1 g/dL Final   HCT  Date Value Ref Range Status  07/05/2024 42.1 38.5 - 50.0 % Final  MCHC  Date Value Ref Range Status  07/05/2024 32.5 32.0 - 36.0 g/dL Final    Comment:    For adults, a slight decrease in the calculated MCHC value (in the range of 30 to 32 g/dL) is most likely not clinically significant; however, it should be interpreted with caution in correlation with other red cell parameters and the patient's clinical condition.    [facility]  Date Value Ref Range Status  07/05/2024 30.8 27.0 - 33.0 pg Final   MCV  Date Value Ref Range Status  07/05/2024 94.6 80.0 - 100.0 fL Final   No results found for: PLTCOUNTKUC, LABPLAT, POCPLA RDW  Date Value Ref Range Status  07/05/2024 12.5 11.0 - 15.0 % Final            Requested Prescriptions  Pending Prescriptions Disp Refills   JENTADUETO 2.04-999 MG TABS 60 tablet     Sig: Take 1 tablet by mouth 2 (two) times daily. Pt is taking 1.5 tabs per day.     Endocrinology:  Diabetes - Biguanide + DPP-4 Inhibitor Combos Failed - 12/05/2024  2:13 PM       Failed - HBA1C is between 0 and 7.9 and within 180 days    Hgb A1c MFr Bld  Date Value Ref Range Status  07/05/2024 8.1 (H) <5.7 % Final    Comment:    For someone without known diabetes, a hemoglobin A1c value of 6.5% or greater indicates that they may have  diabetes and this should be confirmed with a follow-up  test. . For someone with known diabetes, a value <7% indicates  that their diabetes is well controlled and a value  greater than or equal to 7% indicates suboptimal  control. A1c targets should be individualized based on  duration of diabetes, age, comorbid conditions, and  other considerations. . Currently, no consensus exists regarding use of hemoglobin A1c for diagnosis of diabetes for children. .          Failed - B12 Level in normal range and within 720 days    No results found for: VITAMINB12       Passed - Cr in normal range and within 360 days    Creat  Date Value Ref Range Status  07/05/2024 0.90 0.70 - 1.35 mg/dL Final   Creatinine, Urine  Date Value Ref Range Status  07/05/2024 82 20 - 320 mg/dL Final         Passed - eGFR in normal range and within 360 days    eGFR  Date Value Ref Range Status  07/05/2024 92 > OR = 60 mL/min/1.87m2 Final         Passed - Valid encounter within last 6 months    Recent Outpatient Visits           3 months ago Levoscoliosis of lumbar spine   [place] [facility] Pickard, Butler DASEN, MD   4 months ago Neck pain   [place] [facility] Duanne Butler DASEN, MD   5 months ago Controlled type 2 diabetes mellitus with complication, without long-term current use of insulin [facility])   [place] [facility] Duanne Butler DASEN, MD   8 months ago Chronic low back pain, unspecified back pain laterality, unspecified whether sciatica present   [place] [facility] Duanne Butler DASEN, MD   10 years ago Bilateral low back pain without sciatica    [facility], Juliane RAMAN, MD  Passed - CBC within normal limits and completed in the last 12 months    WBC  Date Value Ref Range Status  07/05/2024 5.6 3.8 - 10.8 Thousand/uL Final   RBC  Date Value Ref Range Status  07/05/2024 4.45 4.20 - 5.80 Million/uL Final   Hemoglobin  Date Value Ref Range Status  07/05/2024 13.7 13.2 - 17.1 g/dL Final   HCT  Date Value Ref Range Status  07/05/2024 42.1 38.5 - 50.0 % Final   MCHC  Date Value Ref Range Status  07/05/2024 32.5 32.0 - 36.0 g/dL Final    Comment:    For adults, a slight decrease in the calculated MCHC value (in the range of 30 to 32 g/dL) is most likely not clinically significant; however, it should be interpreted with caution in correlation with other red cell parameters and the patient's clinical condition.    [facility]  Date Value Ref Range Status  07/05/2024 30.8 27.0 - 33.0 pg Final   MCV  Date Value Ref Range Status  07/05/2024 94.6 80.0 - 100.0 fL Final   No results found for: PLTCOUNTKUC, LABPLAT, POCPLA RDW  Date Value Ref Range Status  07/05/2024 12.5 11.0 - 15.0 % Final

## 2024-12-05 NOTE — Telephone Encounter (Signed)
 Copied from CRM [id number]. Topic: Clinical - Medication Refill >> Dec 03, 2024  2:35 PM Ivette P wrote: Medication: JENTADUETO 2.04-999 MG TABS  Has the patient contacted their pharmacy? Yes (Agent: If no, request that the patient contact the pharmacy for the refill. If patient does not wish to contact the pharmacy document the reason why and proceed with request.) (Agent: If yes, when and what did the pharmacy advise?)  This is the patient's preferred pharmacy:  [id number] [address] [place] [facility] - [address] [place] [address] Phone: [phone] Fax: [phone]  Is this the correct pharmacy for this prescription? Yes If no, delete pharmacy and type the correct one.   Has the prescription been filled recently? No  Is the patient out of the medication? Yes  Has the patient been seen for an appointment in the last year OR does the patient have an upcoming appointment? Yes  Can we respond through MyChart? Yes  Agent: Please be advised that Rx refills may take up to 3 business days. We ask that you follow-up with your pharmacy. >> Dec 05, 2024  1:07 PM Emylou G wrote: Patient adv.. pharmacy adv it was denied?  Pls review.SABRA JENTADUETO 2.04-999 MG TABS

## 2024-12-06 ENCOUNTER — Other Ambulatory Visit: Payer: Self-pay | Admitting: Family Medicine

## 2024-12-12 ENCOUNTER — Encounter: Payer: Self-pay | Admitting: Rehabilitative and Restorative Service Providers"

## 2024-12-12 ENCOUNTER — Ambulatory Visit: Admitting: Rehabilitative and Restorative Service Providers"

## 2024-12-12 DIAGNOSIS — R262 Difficulty in walking, not elsewhere classified: Secondary | ICD-10-CM | POA: Diagnosis not present

## 2024-12-12 DIAGNOSIS — M5459 Other low back pain: Secondary | ICD-10-CM | POA: Diagnosis not present

## 2024-12-12 DIAGNOSIS — M6281 Muscle weakness (generalized): Secondary | ICD-10-CM

## 2024-12-12 DIAGNOSIS — R293 Abnormal posture: Secondary | ICD-10-CM
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
## 2024-12-26 ENCOUNTER — Encounter: Payer: Self-pay | Admitting: Rehabilitative and Restorative Service Providers"

## 2024-12-26 ENCOUNTER — Ambulatory Visit: Admitting: Rehabilitative and Restorative Service Providers"

## 2024-12-26 DIAGNOSIS — R262 Difficulty in walking, not elsewhere classified: Secondary | ICD-10-CM | POA: Diagnosis not present

## 2024-12-26 DIAGNOSIS — M5459 Other low back pain: Secondary | ICD-10-CM | POA: Diagnosis not present

## 2024-12-26 DIAGNOSIS — R293 Abnormal posture: Secondary | ICD-10-CM | POA: Diagnosis not present

## 2024-12-26 DIAGNOSIS — M6281 Muscle weakness (generalized): Secondary | ICD-10-CM | POA: Diagnosis not present
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
## 2025-01-04 ENCOUNTER — Encounter: Payer: Self-pay | Admitting: Rehabilitative and Restorative Service Providers"

## 2025-01-04 ENCOUNTER — Ambulatory Visit: Admitting: Rehabilitative and Restorative Service Providers"

## 2025-01-04 ENCOUNTER — Ambulatory Visit: Admitting: Family Medicine

## 2025-01-04 DIAGNOSIS — R293 Abnormal posture: Secondary | ICD-10-CM | POA: Diagnosis not present

## 2025-01-04 DIAGNOSIS — M6281 Muscle weakness (generalized): Secondary | ICD-10-CM

## 2025-01-04 DIAGNOSIS — R262 Difficulty in walking, not elsewhere classified: Secondary | ICD-10-CM | POA: Diagnosis not present

## 2025-01-04 DIAGNOSIS — M5459 Other low back pain: Secondary | ICD-10-CM | POA: Diagnosis not present
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
## 2025-01-07 ENCOUNTER — Encounter: Payer: Self-pay | Admitting: Family Medicine

## 2025-01-07 ENCOUNTER — Ambulatory Visit: Admitting: Family Medicine

## 2025-01-07 VITALS — BP 110/62 | HR 73 | Temp 98.0°F | Ht 67.0 in | Wt 184.4 lb

## 2025-01-07 DIAGNOSIS — Z23 Encounter for immunization: Secondary | ICD-10-CM

## 2025-01-07 DIAGNOSIS — E118 Type 2 diabetes mellitus with unspecified complications: Secondary | ICD-10-CM | POA: Diagnosis not present
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
## 2025-01-08 ENCOUNTER — Ambulatory Visit: Payer: Self-pay | Admitting: Family Medicine

## 2025-01-08 LAB — CBC WITH DIFFERENTIAL/PLATELET
Absolute Lymphocytes: 1868 {cells}/uL (ref 850–3900)
Absolute Monocytes: 551 {cells}/uL (ref 200–950)
Basophils Absolute: 29 {cells}/uL (ref 0–200)
Basophils Relative: 0.5 %
Eosinophils Absolute: 394 {cells}/uL (ref 15–500)
Eosinophils Relative: 6.8 %
HCT: 40.9 % (ref 39.4–51.1)
Hemoglobin: 13.6 g/dL (ref 13.2–17.1)
MCH: 31.4 pg (ref 27.0–33.0)
MCHC: 33.3 g/dL (ref 31.6–35.4)
MCV: 94.5 fL (ref 81.4–101.7)
MPV: 9.3 fL (ref 7.5–12.5)
Monocytes Relative: 9.5 %
Neutro Abs: 2958 {cells}/uL (ref 1500–7800)
Neutrophils Relative %: 51 %
Platelets: 282 Thousand/uL (ref 140–400)
RBC: 4.33 Million/uL (ref 4.20–5.80)
RDW: 12.9 % (ref 11.0–15.0)
Total Lymphocyte: 32.2 %
WBC: 5.8 Thousand/uL (ref 3.8–10.8)

## 2025-01-08 LAB — HEMOGLOBIN A1C
Hgb A1c MFr Bld: 7.2 % — ABNORMAL HIGH
Mean Plasma Glucose: 160 mg/dL
eAG (mmol/L): 8.9 mmol/L

## 2025-01-08 LAB — LIPID PANEL
Cholesterol: 106 mg/dL
HDL: 59 mg/dL
LDL Cholesterol (Calc): 31 mg/dL
Non-HDL Cholesterol (Calc): 47 mg/dL
Total CHOL/HDL Ratio: 1.8 (calc)
Triglycerides: 80 mg/dL

## 2025-01-08 LAB — COMPREHENSIVE METABOLIC PANEL WITH GFR
AG Ratio: 1.7 (calc) (ref 1.0–2.5)
ALT: 9 U/L (ref 9–46)
AST: 13 U/L (ref 10–35)
Albumin: 4.7 g/dL (ref 3.6–5.1)
Alkaline phosphatase (APISO): 32 U/L — ABNORMAL LOW (ref 35–144)
BUN: 9 mg/dL (ref 7–25)
CO2: 29 mmol/L (ref 20–32)
Calcium: 9.5 mg/dL (ref 8.6–10.3)
Chloride: 98 mmol/L (ref 98–110)
Creat: 0.74 mg/dL (ref 0.70–1.35)
Globulin: 2.7 g/dL (ref 1.9–3.7)
Glucose, Bld: 136 mg/dL — ABNORMAL HIGH (ref 65–99)
Potassium: 5.2 mmol/L (ref 3.5–5.3)
Sodium: 136 mmol/L (ref 135–146)
Total Bilirubin: 0.7 mg/dL (ref 0.2–1.2)
Total Protein: 7.4 g/dL (ref 6.1–8.1)
eGFR: 98 mL/min/1.73m2

## 2025-01-08 LAB — MICROALBUMIN / CREATININE URINE RATIO
Creatinine, Urine: 89 mg/dL (ref 20–320)
Microalb Creat Ratio: 3 mg/g{creat}
Microalb, Ur: 0.3 mg/dL

## 2025-01-09 ENCOUNTER — Ambulatory Visit (INDEPENDENT_AMBULATORY_CARE_PROVIDER_SITE_OTHER): Admitting: Rehabilitative and Restorative Service Providers"

## 2025-01-09 ENCOUNTER — Encounter: Payer: Self-pay | Admitting: Rehabilitative and Restorative Service Providers"

## 2025-01-09 DIAGNOSIS — M5459 Other low back pain: Secondary | ICD-10-CM

## 2025-01-09 DIAGNOSIS — R293 Abnormal posture: Secondary | ICD-10-CM

## 2025-01-09 DIAGNOSIS — M6281 Muscle weakness (generalized): Secondary | ICD-10-CM

## 2025-01-09 DIAGNOSIS — R262 Difficulty in walking, not elsewhere classified: Secondary | ICD-10-CM
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
## 2025-01-10 NOTE — Telephone Encounter (Signed)
 Pt returned call, verbalized understanding. Says No to the ozempic injection offer.

## 2025-01-16 ENCOUNTER — Encounter: Payer: Self-pay | Admitting: Rehabilitative and Restorative Service Providers"

## 2025-01-16 ENCOUNTER — Ambulatory Visit: Admitting: Rehabilitative and Restorative Service Providers"

## 2025-01-16 DIAGNOSIS — R293 Abnormal posture: Secondary | ICD-10-CM | POA: Diagnosis not present

## 2025-01-16 DIAGNOSIS — M6281 Muscle weakness (generalized): Secondary | ICD-10-CM

## 2025-01-16 DIAGNOSIS — M5459 Other low back pain: Secondary | ICD-10-CM

## 2025-01-16 DIAGNOSIS — R262 Difficulty in walking, not elsewhere classified: Secondary | ICD-10-CM | POA: Diagnosis not present
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
## 2025-01-24 ENCOUNTER — Encounter: Admitting: Rehabilitative and Restorative Service Providers"

## 2025-01-31 ENCOUNTER — Ambulatory Visit: Admitting: Rehabilitative and Restorative Service Providers"

## 2025-01-31 ENCOUNTER — Encounter: Payer: Self-pay | Admitting: Rehabilitative and Restorative Service Providers"

## 2025-01-31 DIAGNOSIS — R262 Difficulty in walking, not elsewhere classified: Secondary | ICD-10-CM

## 2025-01-31 DIAGNOSIS — M5459 Other low back pain: Secondary | ICD-10-CM

## 2025-01-31 DIAGNOSIS — M6281 Muscle weakness (generalized): Secondary | ICD-10-CM

## 2025-01-31 DIAGNOSIS — R293 Abnormal posture: Secondary | ICD-10-CM
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
## 2025-02-07 ENCOUNTER — Encounter: Admitting: Rehabilitative and Restorative Service Providers"

## 2025-02-14 ENCOUNTER — Encounter: Admitting: Rehabilitative and Restorative Service Providers"

## 2025-02-20 ENCOUNTER — Encounter: Admitting: Rehabilitative and Restorative Service Providers"

## 2025-02-27 ENCOUNTER — Encounter: Admitting: Rehabilitative and Restorative Service Providers"

## 2025-07-09 ENCOUNTER — Encounter: Admitting: Family Medicine
